# Patient Record
Sex: Female | Born: 2000 | Race: White | Hispanic: Yes | Marital: Single | Smoking: Never smoker
Health system: Southern US, Community
[De-identification: ages and names within clinical notes are randomized; demographics above are authoritative.]

## PROBLEM LIST (undated history)

## (undated) ENCOUNTER — Emergency Department (HOSPITAL_BASED_OUTPATIENT_CLINIC_OR_DEPARTMENT_OTHER): Disposition: A | Discharge status: Still In Hospital | Payer: Medicaid Other

---
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
## 2022-04-20 ENCOUNTER — Emergency Department (HOSPITAL_BASED_OUTPATIENT_CLINIC_OR_DEPARTMENT_OTHER)
Admission: EM | Admit: 2022-04-20 | Discharge: 2022-04-20 | Disposition: A | Discharge status: Home / Self Care | Payer: Medicaid Other | Attending: Emergency Medicine | Admitting: Emergency Medicine

## 2022-04-20 ENCOUNTER — Encounter (HOSPITAL_BASED_OUTPATIENT_CLINIC_OR_DEPARTMENT_OTHER): Payer: Self-pay

## 2022-04-20 ENCOUNTER — Other Ambulatory Visit: Payer: Self-pay

## 2022-04-20 DIAGNOSIS — L309 Dermatitis, unspecified: Secondary | ICD-10-CM | POA: Insufficient documentation

## 2022-04-20 DIAGNOSIS — M25531 Pain in right wrist: Secondary | ICD-10-CM | POA: Insufficient documentation

## 2022-04-20 NOTE — Discharge Instructions (Signed)
Please use Tylenol or ibuprofen for pain.  You may use 600 mg ibuprofen every 6 hours or 1000 mg of Tylenol every 6 hours.  You may choose to alternate between the 2.  This would be most effective.  Not to exceed 4 g of Tylenol within 24 hours.  Not to exceed 3200 mg ibuprofen 24 hours.  I recommend topical 1% hydrocortisone cream for the next 3 to 4 days, would not use it for longer than this, I would recommend avoiding hot showers, using some gentle cream such as CeraVe, Cetaphil in your face is still damp after the shower.  Try to limit use of scent containing make-ups, soaps, or cleaning solutions that may irritate your skin.

## 2022-04-20 NOTE — ED Triage Notes (Signed)
C/o rash to left cheek since this morning.   Also states her right wrist has been bothering her the past few months. Denies known injury.

## 2022-04-20 NOTE — ED Provider Notes (Signed)
[facility] EMERGENCY DEPARTMENT Provider Note   CSN: [id number] Arrival date & time: 04/20/22  [id number]     History  Chief Complaint  Patient presents with   Rash    Morgan Robbins is a 21 y.o. female with past medical history significant for eczema who presents with concern for rash that began on face yesterday which seems somewhat similar to her eczema rash but slightly different as it does not usually come in patches and is more isolated to the individual lesions, as well as intermittent right wrist pain and concern for right wrist pump for several months.  Patient reports that it is exacerbated by work, she has not tried any Tylenol, ibuprofen, wrist brace, or spoken to her PCP or orthopedic doctor regarding the wrist.  She noticed that there is a small bump at the fourth posterior wrist joint.  Rash Associated symptoms: joint pain        Home Medications Prior to Admission medications   Medication Sig Start Date End Date Taking? Authorizing Provider  amoxicillin (AMOXIL) 400 MG/5ML suspension 10 mls po bid x 7 days Patient not taking: Reported on 08/09/2017 11/06/14   Charmayne Sheer, NP  Drospirenone (SLYND) 4 MG TABS Take 1 tablet by mouth daily. 02/13/20   Gavin Pound, CNM  famotidine (PEPCID) 20 MG tablet Take 1 tablet (20 mg total) by mouth 2 (two) times daily. Patient not taking: Reported on 03/26/2018 08/09/17   Larene Pickett, PA-C  ondansetron (ZOFRAN ODT) 4 MG disintegrating tablet Take 1 tablet (4 mg total) by mouth every 8 (eight) hours as needed for nausea. Patient not taking: Reported on 03/26/2018 08/09/17   Larene Pickett, PA-C  permethrin (ELIMITE) 5 % cream Apply from neck down.  Leave in place 8-12 hours, then wash off.  Repeat in 1 week, if needed. Patient not taking: Reported on 08/09/2017 02/11/15   Melony Overly, MD  ranitidine (ZANTAC) 150 MG tablet Take 1 tablet (150 mg total) by mouth 2 (two) times daily. Patient not taking: Reported on 08/09/2017  07/15/17   Willadean Carol, MD      Allergies    Morphine and related    Review of Systems   Review of Systems  Musculoskeletal:  Positive for arthralgias.  Skin:  Positive for rash.  All other systems reviewed and are negative.   Physical Exam Updated Vital Signs BP (!) 147/89 (BP Location: Left Arm)   Pulse 87   Temp 98.4 F (36.9 C) (Oral)   Resp 18   Ht 5\' 4"  (1.626 m)   Wt 72.6 kg   LMP 04/14/2022 (Approximate)   SpO2 100%   BMI 27.46 kg/m  Physical Exam Vitals and nursing note reviewed.  Constitutional:      General: She is not in acute distress.    Appearance: Normal appearance.  HENT:     Head: Normocephalic and atraumatic.  Eyes:     General:        Right eye: No discharge.        Left eye: No discharge.  Cardiovascular:     Rate and Rhythm: Normal rate and regular rhythm.     Pulses: Normal pulses.  Pulmonary:     Effort: Pulmonary effort is normal. No respiratory distress.  Musculoskeletal:        General: No deformity.     Comments: Normal range of motion both passively and actively of right wrist, minimal tenderness with neurologic pressure on posterior wrist during flexion, there  is a small smooth nonmobile bump at the wrist space possibly consistent with ganglion cyst.  No evidence of decreased range of motion, redness, or overlying skin changes.  Normal strength of the right wrist 5/5.  Skin:    General: Skin is warm and dry.     Capillary Refill: Capillary refill takes less than 2 seconds.     Comments: Patient with an itchy, irritated rash on left malar cheek and some small scattered macular lesions on right cheek.  No honey colored crusting, no vesicular rash, no extension of rash into the dermatomal distribution, irritation around eyes.  Skin is dry overall.  Neurological:     Mental Status: She is alert and oriented to person, place, and time.  Psychiatric:        Mood and Affect: Mood normal.        Behavior: Behavior normal.     ED  Results / Procedures / Treatments   Labs (all labs ordered are listed, but only abnormal results are displayed) Labs Reviewed - No data to display  EKG None  Radiology No results found.  Procedures Procedures    Medications Ordered in ED Medications - No data to display  ED Course/ Medical Decision Making/ A&P                           Medical Decision Making  This an overall well-appearing 21 year old female who presents with concern for rash on bilateral cheeks left greater than right as well as right wrist pain for several months, worse with exertion at work.  On physical exam patient's rash seems consistent with an eczematous rash versus contact dermatitis, she has minimal tenderness to palpation of the wrist, does have a small bump possibly consistent with a ganglion cyst.  I see no evidence of secondary bacterial infection on the face, below clinical suspicion for acute fracture, dislocation at the wrist.  Patient is neurovascularly intact at the wrist.  Discussed I recommend conservative ibuprofen, Tylenol, rest, ice, compression, elevation, will provide wrist brace, discussed she can follow-up with PCP if continue to have problems with possible ganglion cyst, discussed I recommend warm but not excessively hot showers, gentle moisturizer, avoidance of scent containing soaps and face creams.  Discussed that I do not recommend extended use but patient can use gentle 1% hydrocortisone cream only on the rash lesions on the face for 3 to 4 days a month but discussed risk of protracted use of steroids on face or skin folds.  Patient understands and agrees to plan, discharged in stable condition at this time. Final Clinical Impression(s) / ED Diagnoses Final diagnoses:  Eczema, unspecified type  Right wrist pain    Rx / DC Orders ED Discharge Orders     None         Anselmo Pickler, PA-C 04/20/22 1034    Lennice Sites, DO 04/20/22 1040

## 2022-06-24 ENCOUNTER — Encounter (HOSPITAL_BASED_OUTPATIENT_CLINIC_OR_DEPARTMENT_OTHER): Payer: Self-pay | Admitting: Emergency Medicine

## 2022-06-24 ENCOUNTER — Emergency Department (HOSPITAL_BASED_OUTPATIENT_CLINIC_OR_DEPARTMENT_OTHER)
Admission: EM | Admit: 2022-06-24 | Discharge: 2022-06-24 | Disposition: A | Discharge status: Home / Self Care | Payer: Medicaid Other | Attending: Emergency Medicine | Admitting: Emergency Medicine

## 2022-06-24 ENCOUNTER — Emergency Department (HOSPITAL_BASED_OUTPATIENT_CLINIC_OR_DEPARTMENT_OTHER): Payer: Medicaid Other | Admitting: Radiology

## 2022-06-24 DIAGNOSIS — R059 Cough, unspecified: Secondary | ICD-10-CM | POA: Diagnosis present

## 2022-06-24 DIAGNOSIS — R000 Tachycardia, unspecified: Secondary | ICD-10-CM | POA: Diagnosis not present

## 2022-06-24 DIAGNOSIS — Z20822 Contact with and (suspected) exposure to covid-19: Secondary | ICD-10-CM | POA: Diagnosis not present

## 2022-06-24 DIAGNOSIS — R051 Acute cough: Secondary | ICD-10-CM | POA: Insufficient documentation

## 2022-06-24 LAB — CBC WITH DIFFERENTIAL/PLATELET
Abs Immature Granulocytes: 0.02 10*3/uL (ref 0.00–0.07)
Basophils Absolute: 0 10*3/uL (ref 0.0–0.1)
Basophils Relative: 0 %
Eosinophils Absolute: 0 10*3/uL (ref 0.0–0.5)
Eosinophils Relative: 0 %
HCT: 38.2 % (ref 36.0–46.0)
Hemoglobin: 12 g/dL (ref 12.0–15.0)
Immature Granulocytes: 0 %
Lymphocytes Relative: 8 %
Lymphs Abs: 0.8 10*3/uL (ref 0.7–4.0)
MCH: 23.7 pg — ABNORMAL LOW (ref 26.0–34.0)
MCHC: 31.4 g/dL (ref 30.0–36.0)
MCV: 75.3 fL — ABNORMAL LOW (ref 80.0–100.0)
Monocytes Absolute: 0.5 10*3/uL (ref 0.1–1.0)
Monocytes Relative: 5 %
Neutro Abs: 8.7 10*3/uL — ABNORMAL HIGH (ref 1.7–7.7)
Neutrophils Relative %: 87 %
Platelets: 297 10*3/uL (ref 150–400)
RBC: 5.07 MIL/uL (ref 3.87–5.11)
RDW: 15.5 % (ref 11.5–15.5)
WBC: 10 10*3/uL (ref 4.0–10.5)
nRBC: 0 % (ref 0.0–0.2)

## 2022-06-24 LAB — D-DIMER, QUANTITATIVE: D-Dimer, Quant: <0.27 ug/mL-FEU (ref 0.00–0.50)

## 2022-06-24 LAB — BASIC METABOLIC PANEL
Anion gap: 9 (ref 5–15)
BUN: 12 mg/dL (ref 6–20)
CO2: 25 mmol/L (ref 22–32)
Calcium: 9.7 mg/dL (ref 8.9–10.3)
Chloride: 103 mmol/L (ref 98–111)
Creatinine, Ser: 0.79 mg/dL (ref 0.44–1.00)
GFR, Estimated: >60 mL/min (ref >60)
Glucose, Bld: 90 mg/dL (ref 70–99)
Potassium: 3.8 mmol/L (ref 3.5–5.1)
Sodium: 137 mmol/L (ref 135–145)

## 2022-06-24 LAB — PREGNANCY, URINE: Preg Test, Ur: NEGATIVE

## 2022-06-24 LAB — RESP PANEL BY RT-PCR (FLU A&B, COVID) ARPGX2
Influenza A by PCR: NEGATIVE
Influenza B by PCR: NEGATIVE
SARS Coronavirus 2 by RT PCR: NEGATIVE

## 2022-06-24 MED ORDER — SODIUM CHLORIDE 0.9 % IV BOLUS
1000.0000 mL | Freq: Once | INTRAVENOUS | Status: AC
  Administered 2022-06-24: 1000 mL via INTRAVENOUS

## 2022-06-24 MED ORDER — ACETAMINOPHEN 325 MG PO TABS
650.0000 mg | ORAL_TABLET | Freq: Once | ORAL | Status: AC
  Administered 2022-06-24: 650 mg via ORAL
  Filled 2022-06-24: qty 2

## 2022-06-24 NOTE — ED Notes (Signed)
Discharge paperwork given and verbally understood.... Provider aware of current HR.

## 2022-06-24 NOTE — Discharge Instructions (Signed)
Please make sure to follow-up with cardiology or primary care provider  Return for new or worsening symptoms

## 2022-06-24 NOTE — ED Provider Notes (Signed)
[facility] EMERGENCY DEPT Provider Note   CSN: [id number] Arrival date & time: 06/24/22  1328     History  Chief Complaint  Patient presents with   Cough    Morgan Robbins is a 21 y.o. female. With no significant past medical history who presents to the emergency department with cough.   States that her symptoms began last night. She describes developing dry cough. She states that symptoms progressed this morning with rhinorrhea, congestion, sore throat. She states that she has some chest pain with deep inspiration. She denies fevers at home. She denies having palpations, lightheadedness, dizziness, syncope, nausea, vomiting, diarrhea. She is tolerating PO well at home. States that her whole family has influenza. Denies recent long flights or drive, surgery, malignancy. Non smoker. Not on estrogen containing birth control. No history of DVT or PE.   HPI     Home Medications Prior to Admission medications   Medication Sig Start Date End Date Taking? Authorizing Provider  amoxicillin (AMOXIL) 400 MG/5ML suspension 10 mls po bid x 7 days Patient not taking: Reported on 08/09/2017 11/06/14   Viviano Simas, NP  Drospirenone (SLYND) 4 MG TABS Take 1 tablet by mouth daily. 02/13/20   Gerrit Heck, CNM  famotidine (PEPCID) 20 MG tablet Take 1 tablet (20 mg total) by mouth 2 (two) times daily. Patient not taking: Reported on 03/26/2018 08/09/17   Garlon Hatchet, PA-C  ondansetron (ZOFRAN ODT) 4 MG disintegrating tablet Take 1 tablet (4 mg total) by mouth every 8 (eight) hours as needed for nausea. Patient not taking: Reported on 03/26/2018 08/09/17   Garlon Hatchet, PA-C  permethrin (ELIMITE) 5 % cream Apply from neck down.  Leave in place 8-12 hours, then wash off.  Repeat in 1 week, if needed. Patient not taking: Reported on 08/09/2017 02/11/15   Charm Rings, MD  ranitidine (ZANTAC) 150 MG tablet Take 1 tablet (150 mg total) by mouth 2 (two) times daily. Patient not taking:  Reported on 08/09/2017 07/15/17   Vicki Mallet, MD      Allergies    Morphine and related    Review of Systems   Review of Systems  HENT:  Positive for congestion, rhinorrhea and sore throat.   Respiratory:  Positive for cough. Negative for wheezing.   All other systems reviewed and are negative.   Physical Exam Updated Vital Signs BP (!) 152/103   Pulse (!) 132   Temp 99.9 F (37.7 C)   Resp 18   Ht 5\' 4"  (1.626 m)   Wt 77.1 kg   LMP 06/17/2022 (Approximate)   SpO2 100%   BMI 29.18 kg/m  Physical Exam Vitals and nursing note reviewed.  Constitutional:      General: She is not in acute distress.    Appearance: Normal appearance. She is ill-appearing. She is not toxic-appearing.  HENT:     Head: Normocephalic.     Mouth/Throat:     Mouth: Mucous membranes are moist.     Pharynx: Uvula midline. Posterior oropharyngeal erythema present.     Tonsils: No tonsillar exudate. 2+ on the right. 2+ on the left.  Eyes:     General: No scleral icterus.    Extraocular Movements: Extraocular movements intact.  Cardiovascular:     Rate and Rhythm: Regular rhythm. Tachycardia present.     Pulses: Normal pulses.     Heart sounds: No murmur heard. Pulmonary:     Effort: Pulmonary effort is normal. No respiratory distress.  Breath sounds: Normal breath sounds. No wheezing, rhonchi or rales.  Abdominal:     General: Bowel sounds are normal. There is no distension.     Palpations: Abdomen is soft.     Tenderness: There is no abdominal tenderness.  Musculoskeletal:     Cervical back: Neck supple.     Right lower leg: No edema.     Left lower leg: No edema.  Skin:    General: Skin is warm and dry.     Capillary Refill: Capillary refill takes less than 2 seconds.  Neurological:     General: No focal deficit present.     Mental Status: She is alert and oriented to person, place, and time. Mental status is at baseline.  Psychiatric:        Mood and Affect: Mood normal.         Behavior: Behavior normal.        Thought Content: Thought content normal.        Judgment: Judgment normal.     ED Results / Procedures / Treatments   Labs (all labs ordered are listed, but only abnormal results are displayed) Labs Reviewed  RESP PANEL BY RT-PCR (FLU A&B, COVID) ARPGX2  BASIC METABOLIC PANEL  D-DIMER, QUANTITATIVE  CBC WITH DIFFERENTIAL/PLATELET    EKG None  Radiology No results found.  Procedures Procedures   Medications Ordered in ED Medications  sodium chloride 0.9 % bolus 1,000 mL (has no administration in time range)  acetaminophen (TYLENOL) tablet 650 mg (650 mg Oral Given 06/24/22 1445)    ED Course/ Medical Decision Making/ A&P                           Medical Decision Making Amount and/or Complexity of Data Reviewed Labs: ordered.  Risk OTC drugs.  Care of patient being handed off to Britini Henderly, PA-C. Patient is pending lab work and IVF. On my exam she was tachycardic to 130-140. She is denying palpitations or chest pain. She was negative for COVID/flu but has 2 positive family members with influenza, so likely has influenza. Cannot PERC her out due to her heart rate. She is Wells low risk but obtaining d-dimer given her tachycardia. Tachycardia is not related to fever. I checked this in the room and she was 98.5 oral. Additionally has been tolerating PO without vomiting or diarrhea. Will need re-evaluation after labs and fluid resuscitation.   Final Clinical Impression(s) / ED Diagnoses Final diagnoses:  None    Rx / DC Orders ED Discharge Orders     None         Cristopher Peru, PA-C 06/24/22 1524    Derwood Kaplan, MD 06/25/22 1934

## 2022-06-24 NOTE — ED Provider Notes (Signed)
Care assumed from previous provider at shift change.   In summation 21 year old who came in for cough.  Began yesterday.  Some rhinorrhea, congestion and sore throat.  No clinical evidence of VTE on exam.  She was noted to be tachycardic.  Family did have similar URI symptoms  COVID, flu negative  Plan to follow-up on labs and imaging  Physical Exam  BP 130/87   Pulse (!) 122   Temp 99.9 F (37.7 C)   Resp (!) 23   Ht 5\' 4"  (1.626 m)   Wt 77.1 kg   LMP 06/17/2022 (Approximate)   SpO2 97%   BMI 29.18 kg/m   Physical Exam Vitals and nursing note reviewed.  Constitutional:      General: She is not in acute distress.    Appearance: She is well-developed. She is not ill-appearing.  HENT:     Head: Atraumatic.  Eyes:     Pupils: Pupils are equal, round, and reactive to light.  Cardiovascular:     Rate and Rhythm: Tachycardia present.     Heart sounds: Normal heart sounds.  Pulmonary:     Effort: Pulmonary effort is normal. No respiratory distress.     Breath sounds: Normal breath sounds.  Abdominal:     General: Bowel sounds are normal. There is no distension.     Palpations: Abdomen is soft.  Musculoskeletal:        General: No swelling, tenderness, deformity or signs of injury. Normal range of motion.     Cervical back: Normal range of motion.     Right lower leg: No edema.     Left lower leg: No edema.  Skin:    General: Skin is warm and dry.  Neurological:     General: No focal deficit present.     Mental Status: She is alert.  Psychiatric:        Mood and Affect: Mood normal.     Procedures  Procedures Labs Reviewed  CBC WITH DIFFERENTIAL/PLATELET - Abnormal; Notable for the following components:      Result Value   MCV 75.3 (*)    MCH 23.7 (*)    Neutro Abs 8.7 (*)    All other components within normal limits  RESP PANEL BY RT-PCR (FLU A&B, COVID) ARPGX2  BASIC METABOLIC PANEL  PREGNANCY, URINE  D-DIMER, QUANTITATIVE (NOT AT [facility])   DG Chest 2  View  Result Date: 06/24/2022 CLINICAL DATA:  Cough and fever.  Chest pain.  Hypertension. EXAM: CHEST - 2 VIEW COMPARISON:  02/06/2019 FINDINGS: The heart size and mediastinal contours are within normal limits. Both lungs are clear. The visualized skeletal structures are unremarkable. IMPRESSION: No active cardiopulmonary disease. Electronically Signed   By: 02/08/2019 M.D.   On: 06/24/2022 16:13    ED Course / MDM     Patient care assumed from previous provider.  See note for full HPI.  Examination 21 year old here for evaluation of URI symptoms.  Plan to follow-up on labs and imaging.  CBC without leukocytosis COVID, flu negative Metabolic panel not significant mall D-dimer less than 0.27 Pregnancy test negative EKG sinus tachycardia Chest x-ray without pneumonia, infiltrates, cardiomegaly, pulm edema  Discussed results with patient.  She is been given 1 L of fluids.  She has had some persistent tachycardia, will slightly improve however increases.  She has no symptoms to suggest thyroid disorder.  Patient states for long period of time she has been dealing with tachycardia when she goes from sitting to  standing.  Was originally told by PCP to cut out caffeine which she has.  States she has had normal thyroid studies in the past.  She was supposed to follow-up with cardiology however has not.  Patient currently asymptomatic.  Low suspicion for sepsis, arrhythmia, thyroid storm, DVT, PE.  Will have her follow-up with cardiology for possible Holter monitor.  She will return for new or worsening symptoms.  The patient has been appropriately medically screened and/or stabilized in the ED. I have low suspicion for any other emergent medical condition which would require further screening, evaluation or treatment in the ED or require inpatient management.  Patient is hemodynamically stable and in no acute distress.  Patient able to ambulate in department prior to ED.  Evaluation does not show  acute pathology that would require ongoing or additional emergent interventions while in the emergency department or further inpatient treatment.  I have discussed the diagnosis with the patient and answered all questions.  Pain is been managed while in the emergency department and patient has no further complaints prior to discharge.  Patient is comfortable with plan discussed in room and is stable for discharge at this time.  I have discussed strict return precautions for returning to the emergency department.  Patient was encouraged to follow-up with PCP/specialist refer to at discharge.    Medical Decision Making Amount and/or Complexity of Data Reviewed External Data Reviewed: labs, radiology, ECG and notes. Labs: ordered. Decision-making details documented in ED Course. Radiology: ordered and independent interpretation performed. Decision-making details documented in ED Course. ECG/medicine tests: ordered and independent interpretation performed. Decision-making details documented in ED Course.  Risk OTC drugs. Prescription drug management. Parenteral controlled substances. Decision regarding hospitalization. Diagnosis or treatment significantly limited by social determinants of health.    Cough tachycardia      Jerrelle Michelsen A, PA-C 06/24/22 1744    Sloan Leiter, DO 06/25/22 2012

## 2022-06-24 NOTE — ED Triage Notes (Signed)
Pt here from home with c/o cold symptoms , cough and congestion , throat red , no fevers

## 2022-07-08 ENCOUNTER — Encounter: Payer: Self-pay | Admitting: *Deleted

## 2023-06-17 ENCOUNTER — Emergency Department (HOSPITAL_BASED_OUTPATIENT_CLINIC_OR_DEPARTMENT_OTHER)
Admission: EM | Admit: 2023-06-17 | Discharge: 2023-06-17 | Disposition: A | Discharge status: Home / Self Care | Payer: Medicaid Other | Attending: Emergency Medicine | Admitting: Emergency Medicine

## 2023-06-17 ENCOUNTER — Emergency Department (HOSPITAL_BASED_OUTPATIENT_CLINIC_OR_DEPARTMENT_OTHER): Payer: Medicaid Other

## 2023-06-17 ENCOUNTER — Encounter (HOSPITAL_BASED_OUTPATIENT_CLINIC_OR_DEPARTMENT_OTHER): Payer: Self-pay | Admitting: Emergency Medicine

## 2023-06-17 ENCOUNTER — Other Ambulatory Visit: Payer: Self-pay

## 2023-06-17 DIAGNOSIS — M545 Low back pain, unspecified: Secondary | ICD-10-CM | POA: Diagnosis not present

## 2023-06-17 DIAGNOSIS — W1839XA Other fall on same level, initial encounter: Secondary | ICD-10-CM | POA: Diagnosis not present

## 2023-06-17 DIAGNOSIS — M25551 Pain in right hip: Secondary | ICD-10-CM | POA: Diagnosis present

## 2023-06-17 LAB — PREGNANCY, URINE: Preg Test, Ur: NEGATIVE

## 2023-06-17 MED ORDER — KETOROLAC TROMETHAMINE 60 MG/2ML IM SOLN
30.0000 mg | Freq: Once | INTRAMUSCULAR | Status: AC
  Administered 2023-06-17: 30 mg via INTRAMUSCULAR
  Filled 2023-06-17: qty 2

## 2023-06-17 NOTE — Discharge Instructions (Addendum)
It was a pleasure taking care of you this evening.  You were evaluated in the emergency room for right hip pain following a fall.  An x-ray was taken which did not show any fracture or dislocation.  You were given Toradol for pain.  Please continue to use Tylenol and ibuprofen as needed for pain in addition to applying heat and ice as needed.  If you continue to have persistent symptoms please follow-up with your primary care for further evaluation.  If you find that you are unable to walk or experience severe pain please return to the emergency room.

## 2023-06-17 NOTE — ED Triage Notes (Signed)
Tylenol at 1 pm did not help

## 2023-06-17 NOTE — ED Triage Notes (Signed)
Pt fell forward on her right knee today, she states at first didn't hurt but now her thigh/back/ and side are hurting.

## 2023-06-17 NOTE — ED Provider Notes (Signed)
[place] EMERGENCY DEPARTMENT AT [facility] Provider Note   CSN: [id number] Arrival date & time: 06/17/23  1634     History  No chief complaint on file.   Morgan Robbins is a 22 y.o. female who presents with right low back and hip pain following a mechanical fall earlier today.  She states she was playing tag when her leg gave out and she fell on her right knee and hands.  She denies hitting her head.  No LOC.  She is states the pain seems to radiate down her right thigh.  He took Tylenol earlier today without any notable improvement.  She is able to weight-bear, however pain is worse while standing.  HPI     Home Medications Prior to Admission medications   Medication Sig Start Date End Date Taking? Authorizing Provider  Drospirenone (SLYND) 4 MG TABS Take 1 tablet by mouth daily. 02/13/20   Gerrit Heck, CNM      Allergies    Morphine and codeine    Review of Systems   Review of Systems  Musculoskeletal:  Positive for arthralgias and back pain.    Physical Exam Updated Vital Signs BP (!) 147/80 (BP Location: Right Arm)   Pulse 77   Temp 97.7 F (36.5 C)   Resp 16   Wt 77.1 kg   SpO2 100%   BMI 29.18 kg/m  Physical Exam Vitals and nursing note reviewed.  Constitutional:      General: She is not in acute distress.    Appearance: She is well-developed.  HENT:     Head: Normocephalic and atraumatic.  Eyes:     Conjunctiva/sclera: Conjunctivae normal.  Cardiovascular:     Rate and Rhythm: Regular rhythm.     Pulses: Normal pulses.  Pulmonary:     Effort: Pulmonary effort is normal. No respiratory distress.  Musculoskeletal:     Cervical back: Neck supple.     Comments: Tenderness to right lower lumbar and iliac crest area.  No ecchymosis or deformity.  No midline tenderness.  Ambulates without difficulty.  No significant knee tenderness.  Stable to varus valgus.  No significant swelling.  No erythema or overlying warmth over the hip or knee.   Skin:    General: Skin is warm and dry.  Neurological:     Mental Status: She is alert.  Psychiatric:        Mood and Affect: Mood normal.     ED Results / Procedures / Treatments   Labs (all labs ordered are listed, but only abnormal results are displayed) Labs Reviewed  PREGNANCY, URINE    EKG None  Radiology DG Pelvis 1-2 Views  Result Date: 06/17/2023 CLINICAL DATA:  Pain after fall EXAM: PELVIS - 1 VIEW COMPARISON:  X-ray 11/21/2016 FINDINGS: There is no evidence of pelvic fracture or diastasis. No pelvic bone lesions are seen. IMPRESSION: No acute osseous abnormality. Electronically Signed   By: Karen Kays M.D.   On: 06/17/2023 18:25    Procedures Procedures    Medications Ordered in ED Medications  ketorolac (TORADOL) injection 30 mg (30 mg Intramuscular Given 06/17/23 1744)    ED Course/ Medical Decision Making/ A&P                                 Medical Decision Making  This patient presents to the ED with chief complaint(s) of right hip pain with no pertinent past medical history the  complaint involves an extensive differential diagnosis and also carries with it a high risk of complications and morbidity.    The differential diagnosis includes  Fracture, dislocation, contusion, tendon or ligament injury, septic arthritis The initial plan is to  Will start with IM Toradol and x-ray of right hip and pelvis Additional history obtained: No additional historians or records utilized  Initial Assessment:   Patient is well-appearing.  She is able to ambulate.  Demonstrates some mild tenderness more paraspinal, iliac crest.  Low suspicion for fracture as she did not fall on this area.  Will obtain an x-ray for further evaluation.  No warmth, tolerates full range of motion.  No evidence this is just septic arthritis or dislocation  Independent ECG interpretation:  None  Independent labs interpretation:  The following labs were independently interpreted:   None  Independent visualization and interpretation of imaging: I independently visualized the following imaging with scope of interpretation limited to determining acute life threatening conditions related to emergency care: Right hip and pelvis, which revealed no fracture or dislocation  Treatment and Reassessment: Patient given IM Toradol Upon reevaluation at 6:40 PM patient is without significant pain  Consultations obtained:   None  Disposition:   Patient will be discharged home with close PCP follow-up should her symptoms persist.  The patient has been appropriately medically screened and/or stabilized in the ED. I have low suspicion for any other emergent medical condition which would require further screening, evaluation or treatment in the ED or require inpatient management. At time of discharge the patient is hemodynamically stable and in no acute distress. I have discussed work-up results and diagnosis with patient and answered all questions. Patient is agreeable with discharge plan. We discussed strict return precautions for returning to the emergency department and they verbalized understanding.     Social Determinants of Health:   None         Final Clinical Impression(s) / ED Diagnoses Final diagnoses:  Right hip pain    Rx / DC Orders ED Discharge Orders     None         Fabienne Bruns 06/17/23 1852    Laurence Spates, MD 06/18/23 1452
